# Patient Record
Sex: Female | Born: 1990 | Race: White | Hispanic: No | Marital: Married | Smoking: Never smoker
Health system: Southern US, Community
[De-identification: ages and names within clinical notes are randomized; demographics above are authoritative.]

## PROBLEM LIST (undated history)

## (undated) DIAGNOSIS — E7800 Pure hypercholesterolemia, unspecified: Secondary | ICD-10-CM

---
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
## 2011-08-12 ENCOUNTER — Emergency Department (HOSPITAL_COMMUNITY): Payer: BC Managed Care – PPO

## 2011-08-12 ENCOUNTER — Emergency Department (HOSPITAL_COMMUNITY)
Admission: EM | Admit: 2011-08-12 | Discharge: 2011-08-12 | Disposition: A | Discharge status: Home / Self Care | Payer: BC Managed Care – PPO | Attending: Emergency Medicine | Admitting: Emergency Medicine

## 2011-08-12 ENCOUNTER — Encounter (HOSPITAL_COMMUNITY): Payer: Self-pay | Admitting: Emergency Medicine

## 2011-08-12 DIAGNOSIS — R197 Diarrhea, unspecified: Secondary | ICD-10-CM | POA: Insufficient documentation

## 2011-08-12 DIAGNOSIS — R112 Nausea with vomiting, unspecified: Secondary | ICD-10-CM | POA: Insufficient documentation

## 2011-08-12 DIAGNOSIS — R109 Unspecified abdominal pain: Secondary | ICD-10-CM | POA: Insufficient documentation

## 2011-08-12 DIAGNOSIS — K5289 Other specified noninfective gastroenteritis and colitis: Secondary | ICD-10-CM | POA: Insufficient documentation

## 2011-08-12 DIAGNOSIS — K529 Noninfective gastroenteritis and colitis, unspecified: Secondary | ICD-10-CM

## 2011-08-12 LAB — CBC
HCT: 39.8 % (ref 36.0–46.0)
Hemoglobin: 14.5 g/dL (ref 12.0–15.0)
MCH: 30.6 pg (ref 26.0–34.0)
MCHC: 36.4 g/dL — ABNORMAL HIGH (ref 30.0–36.0)
MCV: 84 fL (ref 78.0–100.0)
Platelets: 189 10*3/uL (ref 150–400)
RBC: 4.74 MIL/uL (ref 3.87–5.11)
RDW: 12.1 % (ref 11.5–15.5)
WBC: 5.7 10*3/uL (ref 4.0–10.5)

## 2011-08-12 LAB — URINALYSIS, ROUTINE W REFLEX MICROSCOPIC
Bilirubin Urine: NEGATIVE
Glucose, UA: NEGATIVE mg/dL
Hgb urine dipstick: NEGATIVE
Ketones, ur: NEGATIVE mg/dL
Leukocytes, UA: NEGATIVE
Nitrite: NEGATIVE
Protein, ur: NEGATIVE mg/dL
Specific Gravity, Urine: 1.014 (ref 1.005–1.030)
Urobilinogen, UA: 0.2 mg/dL (ref 0.0–1.0)
pH: 6 (ref 5.0–8.0)

## 2011-08-12 LAB — CLOSTRIDIUM DIFFICILE BY PCR: Toxigenic C. Difficile by PCR: NEGATIVE

## 2011-08-12 LAB — DIFFERENTIAL
Basophils Absolute: 0 10*3/uL (ref 0.0–0.1)
Basophils Relative: 0 % (ref 0–1)
Eosinophils Absolute: 0 10*3/uL (ref 0.0–0.7)
Eosinophils Relative: 0 % (ref 0–5)
Lymphocytes Relative: 9 % — ABNORMAL LOW (ref 12–46)
Lymphs Abs: 0.5 10*3/uL — ABNORMAL LOW (ref 0.7–4.0)
Monocytes Absolute: 0.5 10*3/uL (ref 0.1–1.0)
Monocytes Relative: 10 % (ref 3–12)
Neutro Abs: 4.6 10*3/uL (ref 1.7–7.7)
Neutrophils Relative %: 81 % — ABNORMAL HIGH (ref 43–77)

## 2011-08-12 LAB — BASIC METABOLIC PANEL
BUN: 6 mg/dL (ref 6–23)
CO2: 24 mEq/L (ref 19–32)
Calcium: 9.4 mg/dL (ref 8.4–10.5)
Chloride: 100 mEq/L (ref 96–112)
Creatinine, Ser: 0.61 mg/dL (ref 0.50–1.10)
GFR calc Af Amer: >90 mL/min (ref >90)
GFR calc non Af Amer: >90 mL/min (ref >90)
Glucose, Bld: 94 mg/dL (ref 70–99)
Potassium: 4.1 mEq/L (ref 3.5–5.1)
Sodium: 135 mEq/L (ref 135–145)

## 2011-08-12 LAB — POCT PREGNANCY, URINE: Preg Test, Ur: NEGATIVE

## 2011-08-12 LAB — LIPASE, BLOOD: Lipase: 33 U/L (ref 11–59)

## 2011-08-12 MED ORDER — DIPHENOXYLATE-ATROPINE 2.5-0.025 MG PO TABS: 1.0000 | ORAL_TABLET | Freq: Four times a day (QID) | ORAL | Status: DC | PRN

## 2011-08-12 MED ORDER — DIPHENOXYLATE-ATROPINE 2.5-0.025 MG PO TABS: 1.0000 | ORAL_TABLET | Freq: Four times a day (QID) | ORAL | Status: AC | PRN

## 2011-08-12 MED ORDER — MORPHINE SULFATE 4 MG/ML IJ SOLN
4.0000 mg | Freq: Once | INTRAMUSCULAR | Status: AC
  Administered 2011-08-12: 4 mg via INTRAVENOUS
  Filled 2011-08-12: qty 1

## 2011-08-12 MED ORDER — ONDANSETRON HCL 4 MG PO TABS: 4.0000 mg | ORAL_TABLET | Freq: Four times a day (QID) | ORAL | Status: DC

## 2011-08-12 MED ORDER — SODIUM CHLORIDE 0.9 % IV BOLUS (SEPSIS)
1000.0000 mL | Freq: Once | INTRAVENOUS | Status: AC
  Administered 2011-08-12 (×2): 1000 mL via INTRAVENOUS

## 2011-08-12 MED ORDER — OXYCODONE-ACETAMINOPHEN 5-325 MG PO TABS: 2.0000 | ORAL_TABLET | ORAL | Status: DC | PRN

## 2011-08-12 MED ORDER — ONDANSETRON HCL 4 MG PO TABS: 4.0000 mg | ORAL_TABLET | Freq: Four times a day (QID) | ORAL | Status: AC

## 2011-08-12 MED ORDER — OXYCODONE-ACETAMINOPHEN 5-325 MG PO TABS: 2.0000 | ORAL_TABLET | ORAL | Status: AC | PRN

## 2011-08-12 NOTE — ED Notes (Signed)
Pt c/o upper abd pain and diarrhea starting today; pt sts decrease in appetite

## 2011-08-12 NOTE — ED Provider Notes (Signed)
History     CSN: [id number]  Arrival date & time 08/12/11  1502   First MD Initiated Contact with Patient 08/12/11 1753      Chief Complaint  Patient presents with  . Diarrhea  . Abdominal Pain    (Consider location/radiation/quality/duration/timing/severity/associated sxs/prior treatment) Patient is a 21 y.o. female presenting with diarrhea and abdominal pain. The history is provided by the patient and a parent. No language interpreter was used.  Diarrhea The primary symptoms include fever, fatigue, abdominal pain, nausea, vomiting and diarrhea. The illness began 3 to 5 days ago. The onset was gradual. The problem has been gradually worsening.  The illness is also significant for anorexia and bloating. The illness does not include chills, constipation, back pain or itching. Associated medical issues do not include GERD, gallstones, liver disease, alcohol abuse, PUD, gastric bypass, bowel resection, irritable bowel syndrome, hemorrhoids or diverticulitis.  Abdominal Pain The primary symptoms of the illness include abdominal pain, fever, fatigue, nausea, vomiting and diarrhea.  Additional symptoms associated with the illness include anorexia. Symptoms associated with the illness do not include chills, constipation or back pain. Significant associated medical issues do not include PUD, GERD, gallstones, liver disease or diverticulitis.   patient complaining of diarrhea greater than 10 times today. States on Friday she vomited times one and had diarrhea for 5 times the fever of 101. States over the weekend she got better and today the diarrhea came back. Denies blood in stool. Denies fever today. Having generalized abdominal pain. States that she was on antibiotics 3 weeks ago. She is a Physicist, medical.   History reviewed. No pertinent past medical history.  History reviewed. No pertinent past surgical history.  History reviewed. No pertinent family history.  History  Substance Use  Topics  . Smoking status: Never Smoker   . Smokeless tobacco: Not on file  . Alcohol Use: No    OB History    Grav Para Term Preterm Abortions TAB SAB Ect Mult Living                  Review of Systems  Unable to perform ROS Constitutional: Positive for fever and fatigue. Negative for chills.  HENT: Negative.   Eyes: Negative.   Respiratory: Negative.   Cardiovascular: Negative.   Gastrointestinal: Positive for nausea, vomiting, abdominal pain, diarrhea, bloating and anorexia. Negative for constipation, blood in stool, abdominal distention and rectal pain.  Musculoskeletal: Negative for back pain.  Skin: Negative for itching.  Neurological: Negative.  Negative for dizziness, weakness and light-headedness.  Psychiatric/Behavioral: Negative.   All other systems reviewed and are negative.    Allergies  Biaxin and Peanut-containing drug products  Home Medications   Current Outpatient Rx  Name Route Sig Dispense Refill  . ALBUTEROL SULFATE HFA 108 (90 BASE) MCG/ACT IN AERS Inhalation Inhale 2 puffs into the lungs every 6 (six) hours as needed. As needed for exercise induced asthma.    . CETIRIZINE-PSEUDOEPHEDRINE ER 5-120 MG PO TB12 Oral Take 1 tablet by mouth daily as needed. As needed for allergies.    Azzie Roup ACE-ETH ESTRAD-FE 1-20 MG-MCG(24) PO TABS Oral Take 1 tablet by mouth daily.      BP 122/73  Pulse 96  Temp(Src) 98 F (36.7 C) (Oral)  Resp 18  SpO2 100%  Physical Exam  Nursing note and vitals reviewed. Constitutional: She is oriented to person, place, and time. She appears well-developed and well-nourished.  HENT:  Head: Normocephalic.  Eyes: Conjunctivae and EOM are normal.  Pupils are equal, round, and reactive to light.  Neck: Normal range of motion. Neck supple.  Cardiovascular: Normal rate.   Pulmonary/Chest: Effort normal.  Abdominal: Soft. She exhibits no distension. There is tenderness.       Generalized abdominal pain   Musculoskeletal:  Normal range of motion.  Neurological: She is alert and oriented to person, place, and time.  Skin: Skin is warm and dry.  Psychiatric: She has a normal mood and affect.    ED Course  Procedures (including critical care time)  Labs Reviewed  CBC - Abnormal; Notable for the following:    MCHC 36.4 (*)    All other components within normal limits  DIFFERENTIAL - Abnormal; Notable for the following:    Neutrophils Relative 81 (*)    Lymphocytes Relative 9 (*)    Lymphs Abs 0.5 (*)    All other components within normal limits  URINALYSIS, ROUTINE W REFLEX MICROSCOPIC  BASIC METABOLIC PANEL  LIPASE, BLOOD  POCT PREGNANCY, URINE  CLOSTRIDIUM DIFFICILE BY PCR   No results found.   No diagnosis found.    MDM  Gastroenteritis with RUQ abdominal pain.  U/s - for gallbladder dz. Zofran for nausea and lomotil for diarrhea.  Recent antibiotics c-diff pending.  Will follow up with pcp or return to ER if not better.    Labs Reviewed  CBC - Abnormal; Notable for the following:    MCHC 36.4 (*)    All other components within normal limits  DIFFERENTIAL - Abnormal; Notable for the following:    Neutrophils Relative 81 (*)    Lymphocytes Relative 9 (*)    Lymphs Abs 0.5 (*)    All other components within normal limits  URINALYSIS, ROUTINE W REFLEX MICROSCOPIC  BASIC METABOLIC PANEL  LIPASE, BLOOD  POCT PREGNANCY, URINE  CLOSTRIDIUM DIFFICILE BY PCR  LAB REPORT - SCANNED          Jethro Bastos, NP 08/13/11 1005

## 2011-08-12 NOTE — Discharge Instructions (Signed)
Chloe Flores the u/s of your abdomen was normal today.  You have gastroenteritis.  This is contageous.  There are other labs pending.  You will get a call if they come back positive.    Take the zofran for nausea and lomotil for diarrhea.  Take tylenol for fever.  Return for uncontrolled nausea, vomiting and diarrhea. Followup with your PCP this week if not better.  Diet for Diarrhea, Adult Having frequent, runny stools (diarrhea) has many causes. Diarrhea may be caused or worsened by food or drink. Diarrhea may be relieved by changing your diet. IF YOU ARE NOT TOLERATING SOLID FOODS:  Drink enough water and fluids to keep your urine clear or pale yellow.   Avoid sugary drinks and sodas as well as milk-based beverages.   Avoid beverages containing caffeine and alcohol.   You may try rehydrating beverages. You can make your own by following this recipe:    tsp table salt.    tsp baking soda.   ? tsp salt substitute (potassium chloride).   1 tbs + 1 tsp sugar.   1 qt water.  As your stools become more solid, you can start eating solid foods. Add foods one at a time. If a certain food causes your diarrhea to get worse, avoid that food and try other foods. A low fiber, low-fat, and lactose-free diet is recommended. Small, frequent meals may be better tolerated.  Starches  Allowed:  White, [place], and pita breads, plain rolls, buns, bagels. Plain muffins, matzo. Soda, saltine, or graham crackers. Pretzels, melba toast, zwieback. Cooked cereals made with water: cornmeal, farina, cream cereals. Dry cereals: refined corn, wheat, rice. Potatoes prepared any way without skins, refined macaroni, spaghetti, noodles, refined rice.   Avoid:  Bread, rolls, or crackers made with whole wheat, multi-grains, rye, bran seeds, nuts, or coconut. Corn tortillas or taco shells. Cereals containing whole grains, multi-grains, bran, coconut, nuts, or raisins. Cooked or dry oatmeal. Coarse wheat cereals, granola.  Cereals advertised as "high-fiber." Potato skins. Whole grain pasta, wild or brown rice. Popcorn. Sweet potatoes/yams. Sweet rolls, doughnuts, waffles, pancakes, sweet breads.  Vegetables  Allowed: Strained tomato and vegetable juices. Most well-cooked and canned vegetables without seeds. Fresh: Tender lettuce, cucumber without the skin, cabbage, spinach, bean sprouts.   Avoid: Fresh, cooked, or canned: Artichokes, baked beans, beet greens, broccoli, Brussels sprouts, corn, kale, legumes, peas, sweet potatoes. Cooked: Green or red cabbage, spinach. Avoid large servings of any vegetables, because vegetables shrink when cooked, and they contain more fiber per serving than fresh vegetables.  Fruit  Allowed: All fruit juices except prune juice. Cooked or canned: Apricots, applesauce, cantaloupe, cherries, fruit cocktail, grapefruit, grapes, kiwi, mandarin oranges, peaches, pears, plums, watermelon. Fresh: Apples without skin, ripe banana, grapes, cantaloupe, cherries, grapefruit, peaches, oranges, plums. Keep servings limited to  cup or 1 piece.   Avoid: Fresh: Apple with skin, apricots, mango, pears, raspberries, strawberries. Prune juice, stewed or dried prunes. Dried fruits, raisins, dates. Large servings of all fresh fruits.  Meat and Meat Substitutes  Allowed: Ground or well-cooked tender beef, ham, veal, lamb, pork, or poultry. Eggs, plain cheese. Fish, oysters, shrimp, lobster, other seafoods. Liver, organ meats.   Avoid: Tough, fibrous meats with gristle. Peanut butter, smooth or chunky. Cheese, nuts, seeds, legumes, dried peas, beans, lentils.  Milk  Allowed: Yogurt, lactose-free milk, kefir, drinkable yogurt, buttermilk, soy milk.   Avoid: Milk, chocolate milk, beverages made with milk, such as milk shakes.  Soups  Allowed: Bouillon, broth, or soups  made from allowed foods. Any strained soup.   Avoid: Soups made from vegetables that are not allowed, cream or milk-based soups.    Desserts and Sweets  Allowed: Sugar-free gelatin, sugar-free frozen ice pops made without sugar alcohol.   Avoid: Plain cakes and cookies, pie made with allowed fruit, pudding, custard, cream pie. Gelatin, fruit, ice, sherbet, frozen ice pops. Ice cream, ice milk without nuts. Plain hard candy, honey, jelly, molasses, syrup, sugar, chocolate syrup, gumdrops, marshmallows.  Fats and Oils  Allowed: Avoid any fats and oils.   Avoid: Seeds, nuts, olives, avocados. Margarine, butter, cream, mayonnaise, salad oils, plain salad dressings made from allowed foods. Plain gravy, crisp bacon without rind.  Beverages  Allowed: Water, decaffeinated teas, oral rehydration solutions, sugar-free beverages.   Avoid: Fruit juices, caffeinated beverages (coffee, tea, soda or pop), alcohol, sports drinks, or lemon-lime soda or pop.  Condiments  Allowed: Ketchup, mustard, horseradish, vinegar, cream sauce, cheese sauce, cocoa powder. Spices in moderation: allspice, basil, bay leaves, celery powder or leaves, cinnamon, cumin powder, curry powder, ginger, mace, marjoram, onion or garlic powder, oregano, paprika, parsley flakes, ground pepper, rosemary, sage, savory, tarragon, thyme, turmeric.   Avoid: Coconut, honey.  Weight Monitoring: Weigh yourself every day. You should weigh yourself in the morning after you urinate and before you eat breakfast. Wear the same amount of clothing when you weigh yourself. Record your weight daily. Bring your recorded weights to your clinic visits. Tell your caregiver right away if you have gained 3 lb/1.4 kg or more in 1 day, 5 lb/2.3 kg in a week, or whatever amount you were told to report. SEEK IMMEDIATE MEDICAL CARE IF:   You are unable to keep fluids down.   You start to throw up (vomit) or diarrhea keeps coming back (persistent).   Abdominal pain develops, increases, or can be felt in one place (localizes).   You have an oral temperature above 102 F (38.9 C), not  controlled by medicine.   Diarrhea contains blood or mucus.   You develop excessive weakness, dizziness, fainting, or extreme thirst.  MAKE SURE YOU:   Understand these instructions.   Will watch your condition.   Will get help right away if you are not doing well or get worse.  Document Released: 07/27/2003 Document Revised: 04/25/2011 Document Reviewed: 11/17/2008 [facility] Patient Information 2012 [place], [place].Diet for Diarrhea, Adult Having frequent, runny stools (diarrhea) has many causes. Diarrhea may be caused or worsened by food or drink. Diarrhea may be relieved by changing your diet. IF YOU ARE NOT TOLERATING SOLID FOODS:  Drink enough water and fluids to keep your urine clear or pale yellow.   Avoid sugary drinks and sodas as well as milk-based beverages.   Avoid beverages containing caffeine and alcohol.   You may try rehydrating beverages. You can make your own by following this recipe:    tsp table salt.    tsp baking soda.   ? tsp salt substitute (potassium chloride).   1 tbs + 1 tsp sugar.   1 qt water.  As your stools become more solid, you can start eating solid foods. Add foods one at a time. If a certain food causes your diarrhea to get worse, avoid that food and try other foods. A low fiber, low-fat, and lactose-free diet is recommended. Small, frequent meals may be better tolerated.  Starches  Allowed:  White, [place], and pita breads, plain rolls, buns, bagels. Plain muffins, matzo. Soda, saltine, or graham crackers. Pretzels, melba toast, zwieback.  Cooked cereals made with water: cornmeal, farina, cream cereals. Dry cereals: refined corn, wheat, rice. Potatoes prepared any way without skins, refined macaroni, spaghetti, noodles, refined rice.   Avoid:  Bread, rolls, or crackers made with whole wheat, multi-grains, rye, bran seeds, nuts, or coconut. Corn tortillas or taco shells. Cereals containing whole grains, multi-grains, bran, coconut, nuts, or  raisins. Cooked or dry oatmeal. Coarse wheat cereals, granola. Cereals advertised as "high-fiber." Potato skins. Whole grain pasta, wild or brown rice. Popcorn. Sweet potatoes/yams. Sweet rolls, doughnuts, waffles, pancakes, sweet breads.  Vegetables  Allowed: Strained tomato and vegetable juices. Most well-cooked and canned vegetables without seeds. Fresh: Tender lettuce, cucumber without the skin, cabbage, spinach, bean sprouts.   Avoid: Fresh, cooked, or canned: Artichokes, baked beans, beet greens, broccoli, Brussels sprouts, corn, kale, legumes, peas, sweet potatoes. Cooked: Green or red cabbage, spinach. Avoid large servings of any vegetables, because vegetables shrink when cooked, and they contain more fiber per serving than fresh vegetables.  Fruit  Allowed: All fruit juices except prune juice. Cooked or canned: Apricots, applesauce, cantaloupe, cherries, fruit cocktail, grapefruit, grapes, kiwi, mandarin oranges, peaches, pears, plums, watermelon. Fresh: Apples without skin, ripe banana, grapes, cantaloupe, cherries, grapefruit, peaches, oranges, plums. Keep servings limited to  cup or 1 piece.   Avoid: Fresh: Apple with skin, apricots, mango, pears, raspberries, strawberries. Prune juice, stewed or dried prunes. Dried fruits, raisins, dates. Large servings of all fresh fruits.  Meat and Meat Substitutes  Allowed: Ground or well-cooked tender beef, ham, veal, lamb, pork, or poultry. Eggs, plain cheese. Fish, oysters, shrimp, lobster, other seafoods. Liver, organ meats.   Avoid: Tough, fibrous meats with gristle. Peanut butter, smooth or chunky. Cheese, nuts, seeds, legumes, dried peas, beans, lentils.  Milk  Allowed: Yogurt, lactose-free milk, kefir, drinkable yogurt, buttermilk, soy milk.   Avoid: Milk, chocolate milk, beverages made with milk, such as milk shakes.  Soups  Allowed: Bouillon, broth, or soups made from allowed foods. Any strained soup.   Avoid: Soups made from  vegetables that are not allowed, cream or milk-based soups.  Desserts and Sweets  Allowed: Sugar-free gelatin, sugar-free frozen ice pops made without sugar alcohol.   Avoid: Plain cakes and cookies, pie made with allowed fruit, pudding, custard, cream pie. Gelatin, fruit, ice, sherbet, frozen ice pops. Ice cream, ice milk without nuts. Plain hard candy, honey, jelly, molasses, syrup, sugar, chocolate syrup, gumdrops, marshmallows.  Fats and Oils  Allowed: Avoid any fats and oils.   Avoid: Seeds, nuts, olives, avocados. Margarine, butter, cream, mayonnaise, salad oils, plain salad dressings made from allowed foods. Plain gravy, crisp bacon without rind.  Beverages  Allowed: Water, decaffeinated teas, oral rehydration solutions, sugar-free beverages.   Avoid: Fruit juices, caffeinated beverages (coffee, tea, soda or pop), alcohol, sports drinks, or lemon-lime soda or pop.  Condiments  Allowed: Ketchup, mustard, horseradish, vinegar, cream sauce, cheese sauce, cocoa powder. Spices in moderation: allspice, basil, bay leaves, celery powder or leaves, cinnamon, cumin powder, curry powder, ginger, mace, marjoram, onion or garlic powder, oregano, paprika, parsley flakes, ground pepper, rosemary, sage, savory, tarragon, thyme, turmeric.   Avoid: Coconut, honey.  Weight Monitoring: Weigh yourself every day. You should weigh yourself in the morning after you urinate and before you eat breakfast. Wear the same amount of clothing when you weigh yourself. Record your weight daily. Bring your recorded weights to your clinic visits. Tell your caregiver right away if you have gained 3 lb/1.4 kg or more in 1 day, 5 lb/2.3 kg in  a week, or whatever amount you were told to report. SEEK IMMEDIATE MEDICAL CARE IF:   You are unable to keep fluids down.   You start to throw up (vomit) or diarrhea keeps coming back (persistent).   Abdominal pain develops, increases, or can be felt in one place (localizes).    You have an oral temperature above 102 F (38.9 C), not controlled by medicine.   Diarrhea contains blood or mucus.   You develop excessive weakness, dizziness, fainting, or extreme thirst.  MAKE SURE YOU:   Understand these instructions.   Will watch your condition.   Will get help right away if you are not doing well or get worse.  Document Released: 07/27/2003 Document Revised: 04/25/2011 Document Reviewed: 11/17/2008 [facility] Patient Information 2012 [place], [place].Diet for Diarrhea, Adult Having frequent, runny stools (diarrhea) has many causes. Diarrhea may be caused or worsened by food or drink. Diarrhea may be relieved by changing your diet. IF YOU ARE NOT TOLERATING SOLID FOODS:  Drink enough water and fluids to keep your urine clear or pale yellow.   Avoid sugary drinks and sodas as well as milk-based beverages.   Avoid beverages containing caffeine and alcohol.   You may try rehydrating beverages. You can make your own by following this recipe:    tsp table salt.    tsp baking soda.   ? tsp salt substitute (potassium chloride).   1 tbs + 1 tsp sugar.   1 qt water.  As your stools become more solid, you can start eating solid foods. Add foods one at a time. If a certain food causes your diarrhea to get worse, avoid that food and try other foods. A low fiber, low-fat, and lactose-free diet is recommended. Small, frequent meals may be better tolerated.  Starches  Allowed:  White, [place], and pita breads, plain rolls, buns, bagels. Plain muffins, matzo. Soda, saltine, or graham crackers. Pretzels, melba toast, zwieback. Cooked cereals made with water: cornmeal, farina, cream cereals. Dry cereals: refined corn, wheat, rice. Potatoes prepared any way without skins, refined macaroni, spaghetti, noodles, refined rice.   Avoid:  Bread, rolls, or crackers made with whole wheat, multi-grains, rye, bran seeds, nuts, or coconut. Corn tortillas or taco shells. Cereals  containing whole grains, multi-grains, bran, coconut, nuts, or raisins. Cooked or dry oatmeal. Coarse wheat cereals, granola. Cereals advertised as "high-fiber." Potato skins. Whole grain pasta, wild or brown rice. Popcorn. Sweet potatoes/yams. Sweet rolls, doughnuts, waffles, pancakes, sweet breads.  Vegetables  Allowed: Strained tomato and vegetable juices. Most well-cooked and canned vegetables without seeds. Fresh: Tender lettuce, cucumber without the skin, cabbage, spinach, bean sprouts.   Avoid: Fresh, cooked, or canned: Artichokes, baked beans, beet greens, broccoli, Brussels sprouts, corn, kale, legumes, peas, sweet potatoes. Cooked: Green or red cabbage, spinach. Avoid large servings of any vegetables, because vegetables shrink when cooked, and they contain more fiber per serving than fresh vegetables.  Fruit  Allowed: All fruit juices except prune juice. Cooked or canned: Apricots, applesauce, cantaloupe, cherries, fruit cocktail, grapefruit, grapes, kiwi, mandarin oranges, peaches, pears, plums, watermelon. Fresh: Apples without skin, ripe banana, grapes, cantaloupe, cherries, grapefruit, peaches, oranges, plums. Keep servings limited to  cup or 1 piece.   Avoid: Fresh: Apple with skin, apricots, mango, pears, raspberries, strawberries. Prune juice, stewed or dried prunes. Dried fruits, raisins, dates. Large servings of all fresh fruits.  Meat and Meat Substitutes  Allowed: Ground or well-cooked tender beef, ham, veal, lamb, pork, or poultry. Eggs, plain cheese. Fish, oysters,  shrimp, lobster, other seafoods. Liver, organ meats.   Avoid: Tough, fibrous meats with gristle. Peanut butter, smooth or chunky. Cheese, nuts, seeds, legumes, dried peas, beans, lentils.  Milk  Allowed: Yogurt, lactose-free milk, kefir, drinkable yogurt, buttermilk, soy milk.   Avoid: Milk, chocolate milk, beverages made with milk, such as milk shakes.  Soups  Allowed: Bouillon, broth, or soups made from  allowed foods. Any strained soup.   Avoid: Soups made from vegetables that are not allowed, cream or milk-based soups.  Desserts and Sweets  Allowed: Sugar-free gelatin, sugar-free frozen ice pops made without sugar alcohol.   Avoid: Plain cakes and cookies, pie made with allowed fruit, pudding, custard, cream pie. Gelatin, fruit, ice, sherbet, frozen ice pops. Ice cream, ice milk without nuts. Plain hard candy, honey, jelly, molasses, syrup, sugar, chocolate syrup, gumdrops, marshmallows.  Fats and Oils  Allowed: Avoid any fats and oils.   Avoid: Seeds, nuts, olives, avocados. Margarine, butter, cream, mayonnaise, salad oils, plain salad dressings made from allowed foods. Plain gravy, crisp bacon without rind.  Beverages  Allowed: Water, decaffeinated teas, oral rehydration solutions, sugar-free beverages.   Avoid: Fruit juices, caffeinated beverages (coffee, tea, soda or pop), alcohol, sports drinks, or lemon-lime soda or pop.  Condiments  Allowed: Ketchup, mustard, horseradish, vinegar, cream sauce, cheese sauce, cocoa powder. Spices in moderation: allspice, basil, bay leaves, celery powder or leaves, cinnamon, cumin powder, curry powder, ginger, mace, marjoram, onion or garlic powder, oregano, paprika, parsley flakes, ground pepper, rosemary, sage, savory, tarragon, thyme, turmeric.   Avoid: Coconut, honey.  Weight Monitoring: Weigh yourself every day. You should weigh yourself in the morning after you urinate and before you eat breakfast. Wear the same amount of clothing when you weigh yourself. Record your weight daily. Bring your recorded weights to your clinic visits. Tell your caregiver right away if you have gained 3 lb/1.4 kg or more in 1 day, 5 lb/2.3 kg in a week, or whatever amount you were told to report. SEEK IMMEDIATE MEDICAL CARE IF:   You are unable to keep fluids down.   You start to throw up (vomit) or diarrhea keeps coming back (persistent).   Abdominal pain  develops, increases, or can be felt in one place (localizes).   You have an oral temperature above 102 F (38.9 C), not controlled by medicine.   Diarrhea contains blood or mucus.   You develop excessive weakness, dizziness, fainting, or extreme thirst.  MAKE SURE YOU:   Understand these instructions.   Will watch your condition.   Will get help right away if you are not doing well or get worse.  Document Released: 07/27/2003 Document Revised: 04/25/2011 Document Reviewed: 11/17/2008 [facility] Patient Information 2012 [place], [place].Diet for Diarrhea, Adult Having frequent, runny stools (diarrhea) has many causes. Diarrhea may be caused or worsened by food or drink. Diarrhea may be relieved by changing your diet. IF YOU ARE NOT TOLERATING SOLID FOODS:  Drink enough water and fluids to keep your urine clear or pale yellow.   Avoid sugary drinks and sodas as well as milk-based beverages.   Avoid beverages containing caffeine and alcohol.   You may try rehydrating beverages. You can make your own by following this recipe:    tsp table salt.    tsp baking soda.   ? tsp salt substitute (potassium chloride).   1 tbs + 1 tsp sugar.   1 qt water.  As your stools become more solid, you can start eating solid foods. Add foods one  at a time. If a certain food causes your diarrhea to get worse, avoid that food and try other foods. A low fiber, low-fat, and lactose-free diet is recommended. Small, frequent meals may be better tolerated.  Starches  Allowed:  White, [place], and pita breads, plain rolls, buns, bagels. Plain muffins, matzo. Soda, saltine, or graham crackers. Pretzels, melba toast, zwieback. Cooked cereals made with water: cornmeal, farina, cream cereals. Dry cereals: refined corn, wheat, rice. Potatoes prepared any way without skins, refined macaroni, spaghetti, noodles, refined rice.   Avoid:  Bread, rolls, or crackers made with whole wheat, multi-grains, rye, bran seeds,  nuts, or coconut. Corn tortillas or taco shells. Cereals containing whole grains, multi-grains, bran, coconut, nuts, or raisins. Cooked or dry oatmeal. Coarse wheat cereals, granola. Cereals advertised as "high-fiber." Potato skins. Whole grain pasta, wild or brown rice. Popcorn. Sweet potatoes/yams. Sweet rolls, doughnuts, waffles, pancakes, sweet breads.  Vegetables  Allowed: Strained tomato and vegetable juices. Most well-cooked and canned vegetables without seeds. Fresh: Tender lettuce, cucumber without the skin, cabbage, spinach, bean sprouts.   Avoid: Fresh, cooked, or canned: Artichokes, baked beans, beet greens, broccoli, Brussels sprouts, corn, kale, legumes, peas, sweet potatoes. Cooked: Green or red cabbage, spinach. Avoid large servings of any vegetables, because vegetables shrink when cooked, and they contain more fiber per serving than fresh vegetables.  Fruit  Allowed: All fruit juices except prune juice. Cooked or canned: Apricots, applesauce, cantaloupe, cherries, fruit cocktail, grapefruit, grapes, kiwi, mandarin oranges, peaches, pears, plums, watermelon. Fresh: Apples without skin, ripe banana, grapes, cantaloupe, cherries, grapefruit, peaches, oranges, plums. Keep servings limited to  cup or 1 piece.   Avoid: Fresh: Apple with skin, apricots, mango, pears, raspberries, strawberries. Prune juice, stewed or dried prunes. Dried fruits, raisins, dates. Large servings of all fresh fruits.  Meat and Meat Substitutes  Allowed: Ground or well-cooked tender beef, ham, veal, lamb, pork, or poultry. Eggs, plain cheese. Fish, oysters, shrimp, lobster, other seafoods. Liver, organ meats.   Avoid: Tough, fibrous meats with gristle. Peanut butter, smooth or chunky. Cheese, nuts, seeds, legumes, dried peas, beans, lentils.  Milk  Allowed: Yogurt, lactose-free milk, kefir, drinkable yogurt, buttermilk, soy milk.   Avoid: Milk, chocolate milk, beverages made with milk, such as milk shakes.    Soups  Allowed: Bouillon, broth, or soups made from allowed foods. Any strained soup.   Avoid: Soups made from vegetables that are not allowed, cream or milk-based soups.  Desserts and Sweets  Allowed: Sugar-free gelatin, sugar-free frozen ice pops made without sugar alcohol.   Avoid: Plain cakes and cookies, pie made with allowed fruit, pudding, custard, cream pie. Gelatin, fruit, ice, sherbet, frozen ice pops. Ice cream, ice milk without nuts. Plain hard candy, honey, jelly, molasses, syrup, sugar, chocolate syrup, gumdrops, marshmallows.  Fats and Oils  Allowed: Avoid any fats and oils.   Avoid: Seeds, nuts, olives, avocados. Margarine, butter, cream, mayonnaise, salad oils, plain salad dressings made from allowed foods. Plain gravy, crisp bacon without rind.  Beverages  Allowed: Water, decaffeinated teas, oral rehydration solutions, sugar-free beverages.   Avoid: Fruit juices, caffeinated beverages (coffee, tea, soda or pop), alcohol, sports drinks, or lemon-lime soda or pop.  Condiments  Allowed: Ketchup, mustard, horseradish, vinegar, cream sauce, cheese sauce, cocoa powder. Spices in moderation: allspice, basil, bay leaves, celery powder or leaves, cinnamon, cumin powder, curry powder, ginger, mace, marjoram, onion or garlic powder, oregano, paprika, parsley flakes, ground pepper, rosemary, sage, savory, tarragon, thyme, turmeric.   Avoid: Coconut, honey.  Weight  Monitoring: Weigh yourself every day. You should weigh yourself in the morning after you urinate and before you eat breakfast. Wear the same amount of clothing when you weigh yourself. Record your weight daily. Bring your recorded weights to your clinic visits. Tell your caregiver right away if you have gained 3 lb/1.4 kg or more in 1 day, 5 lb/2.3 kg in a week, or whatever amount you were told to report. SEEK IMMEDIATE MEDICAL CARE IF:   You are unable to keep fluids down.   You start to throw up (vomit) or diarrhea  keeps coming back (persistent).   Abdominal pain develops, increases, or can be felt in one place (localizes).   You have an oral temperature above 102 F (38.9 C), not controlled by medicine.   Diarrhea contains blood or mucus.   You develop excessive weakness, dizziness, fainting, or extreme thirst.  MAKE SURE YOU:   Understand these instructions.   Will watch your condition.   Will get help right away if you are not doing well or get worse.  Document Released: 07/27/2003 Document Revised: 04/25/2011 Document Reviewed: 11/17/2008 [facility] Patient Information 2012 [place], [place].

## 2011-08-14 NOTE — ED Provider Notes (Signed)
Medical screening examination/treatment/procedure(s) were performed by non-physician practitioner and as supervising physician I was immediately available for consultation/collaboration.  Raeford Razor, MD 08/14/11 1419

## 2012-11-24 IMAGING — CR DG ABDOMEN ACUTE W/ 1V CHEST
3 series · 3 of 3 positions shown · non-contrast
Comparison: None.

CLINICAL DATA: Right-sided abdominal pain and severe diarrhea for
the past week.  Negative pregnancy test.

ACUTE ABDOMEN SERIES (ABDOMEN 2 VIEW & CHEST 1 VIEW)

[w chest pa]
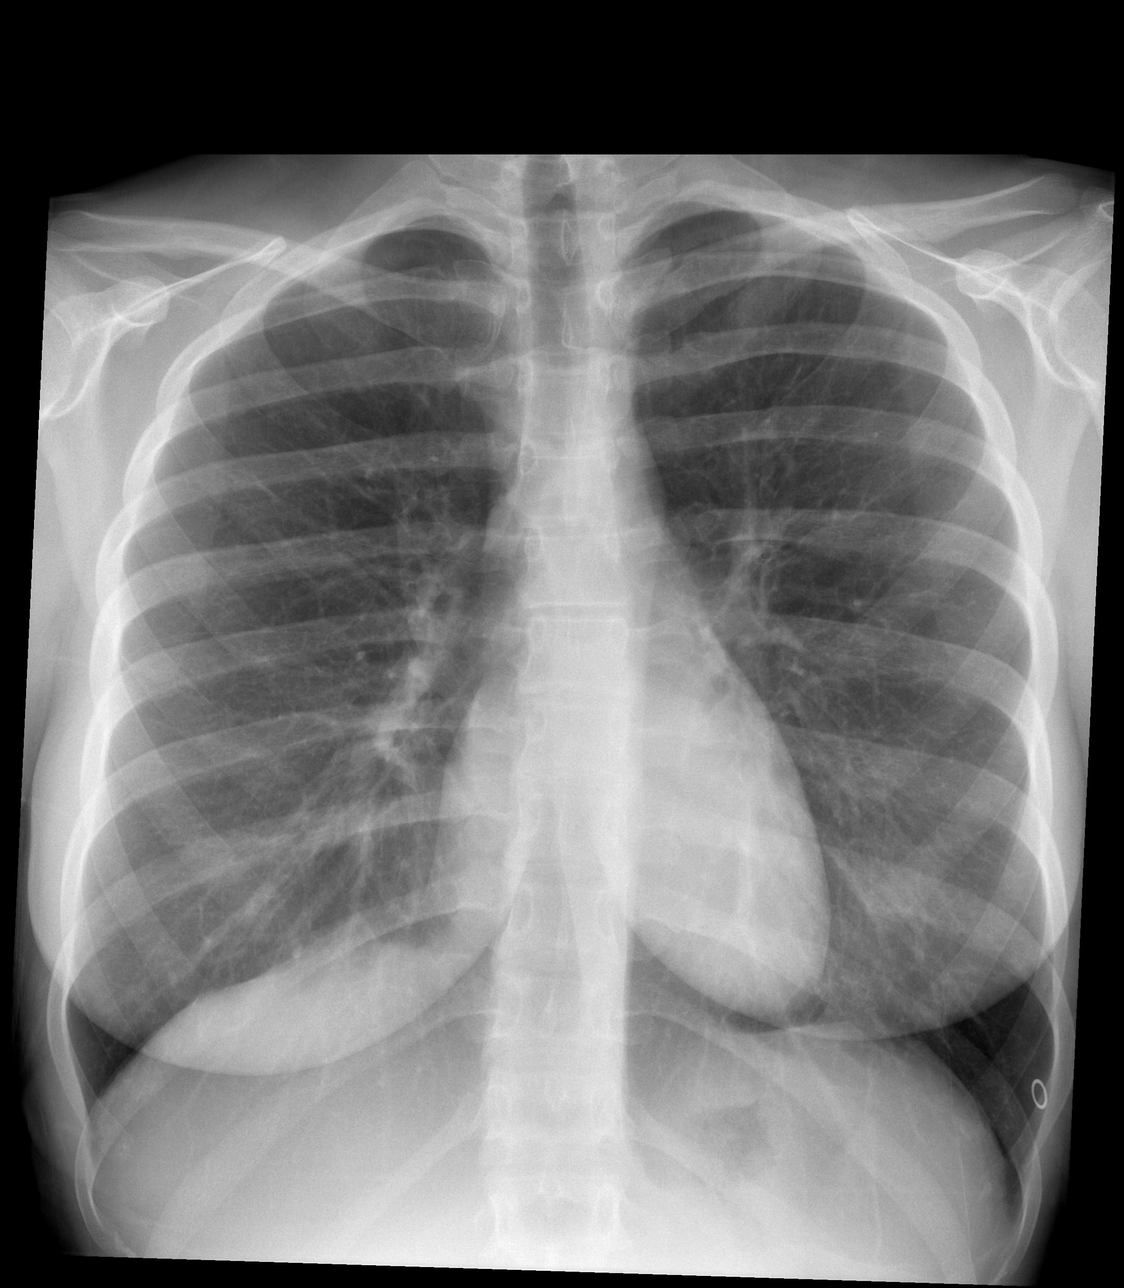

[w abdomen upright]
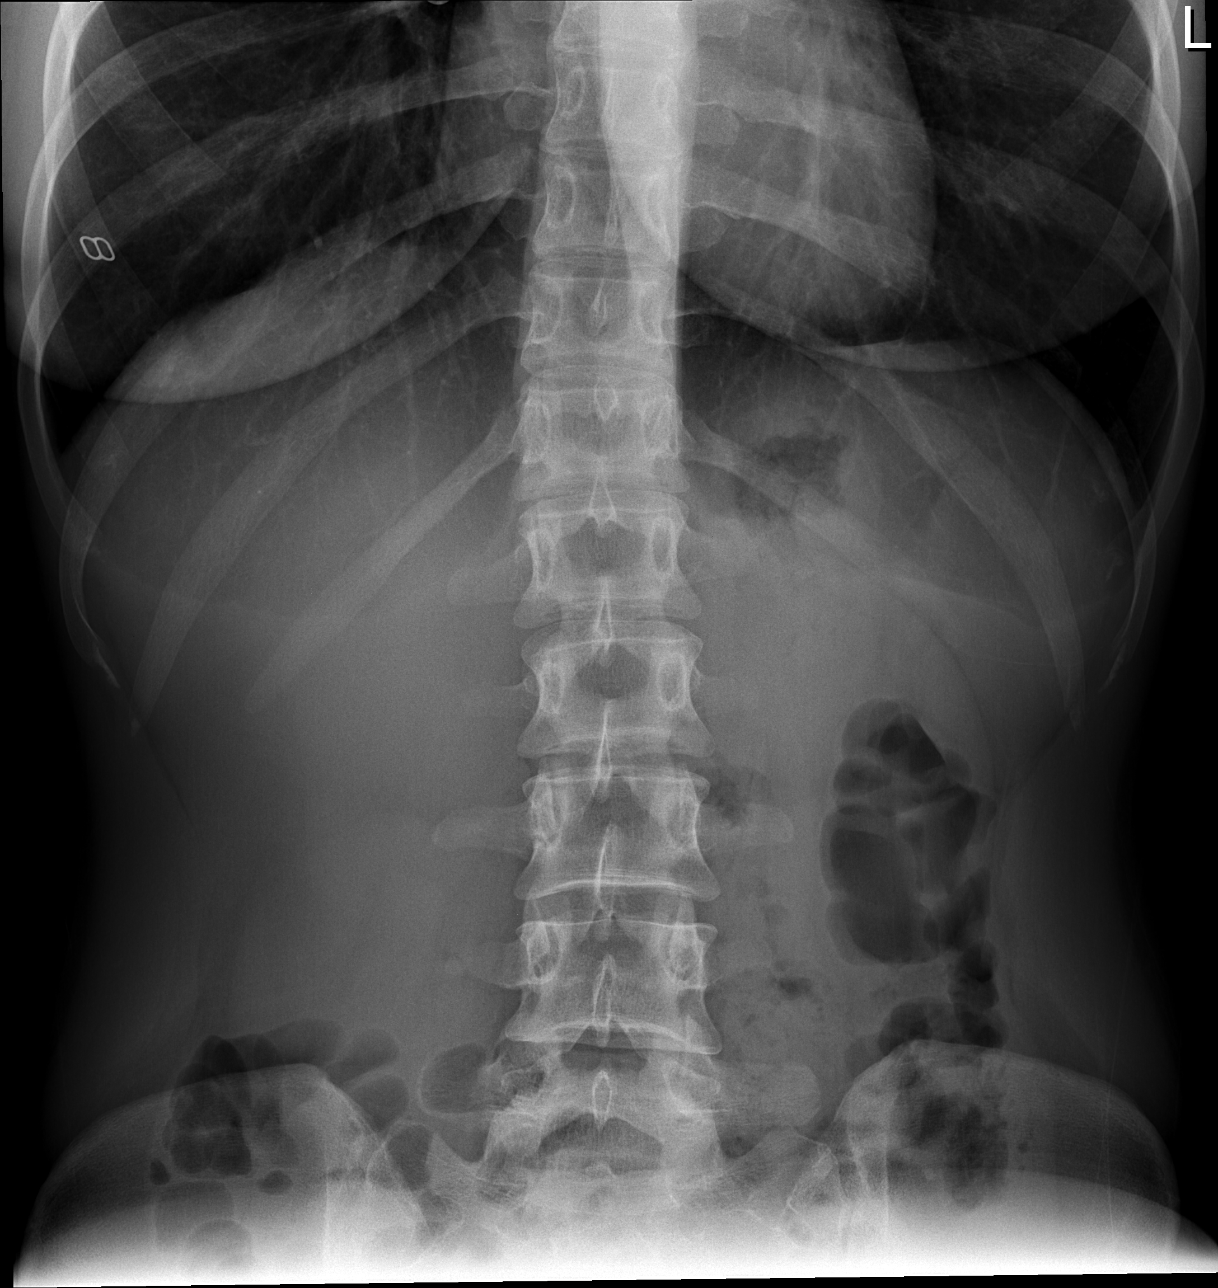

[t abdomen supine]
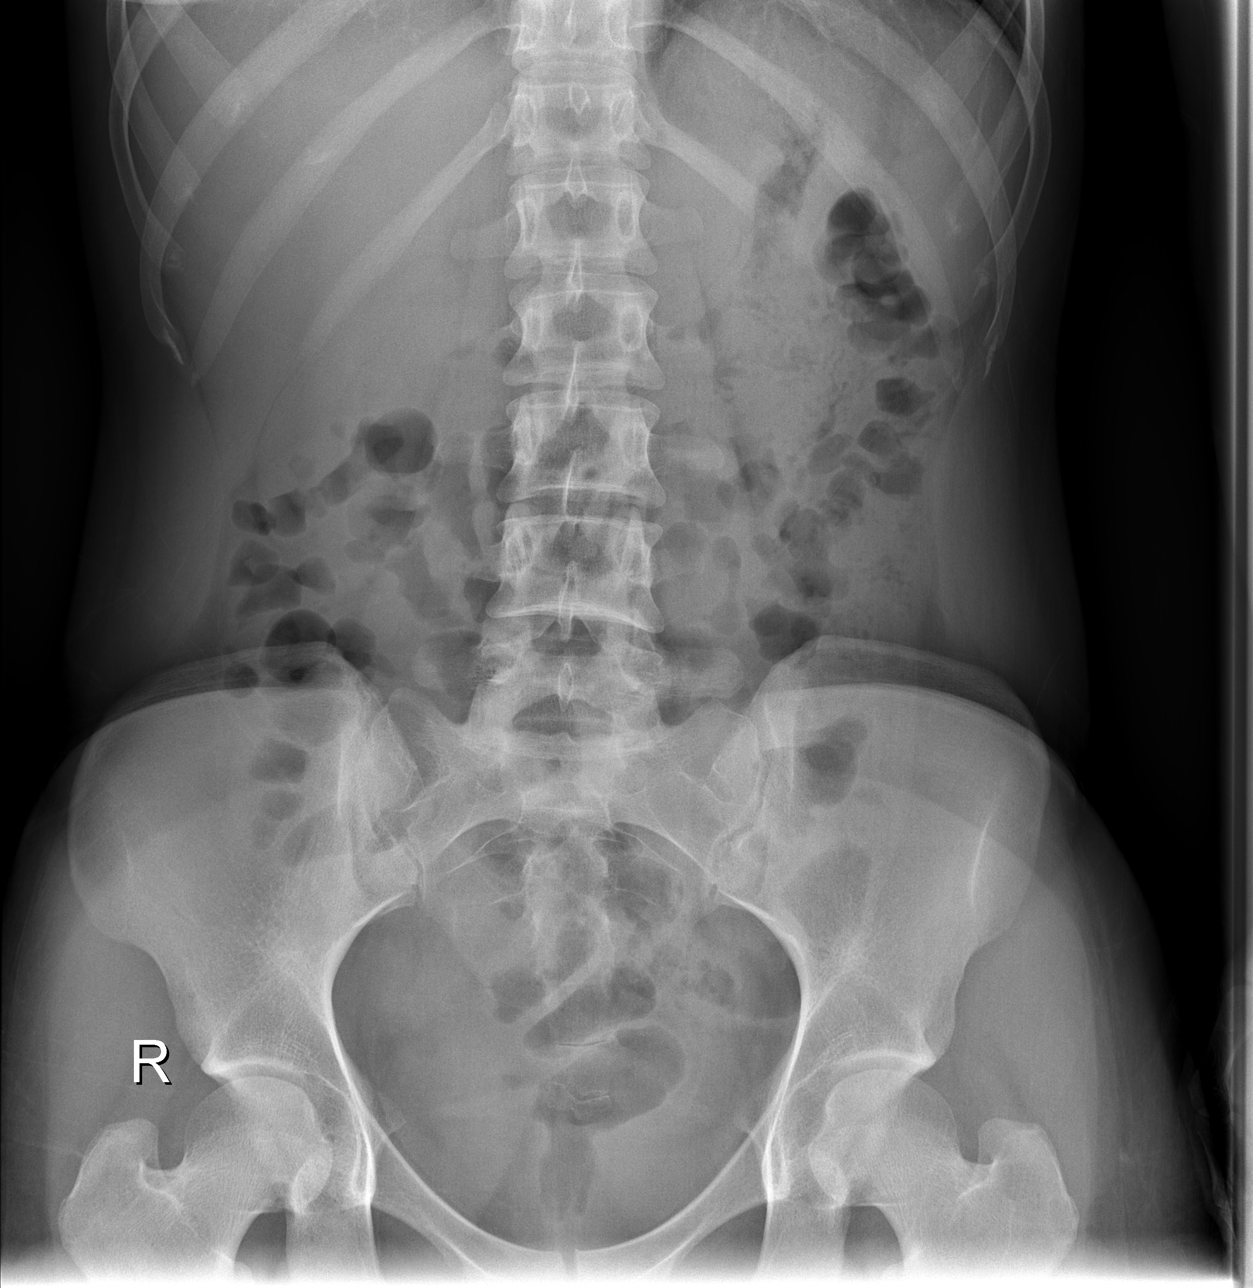

[3 of 3 positions shown; findings below may reference images not displayed]

FINDINGS: The cardiomediastinal silhouette is within normal limits.
Lungs are free of focal consolidations and pleural effusions.

No free intraperitoneal air beneath the diaphragm.  Supine and
erect views show a nonobstructive bowel gas pattern.  No abnormal
calcifications or evidence for organomegaly. Visualized osseous
structures have a normal appearance.
IMPRESSION: Negative exam.

## 2021-04-13 ENCOUNTER — Other Ambulatory Visit: Payer: Self-pay

## 2021-04-13 ENCOUNTER — Encounter (HOSPITAL_BASED_OUTPATIENT_CLINIC_OR_DEPARTMENT_OTHER): Payer: Self-pay | Admitting: *Deleted

## 2021-04-13 ENCOUNTER — Emergency Department (HOSPITAL_BASED_OUTPATIENT_CLINIC_OR_DEPARTMENT_OTHER)
Admission: EM | Admit: 2021-04-13 | Discharge: 2021-04-13 | Disposition: A | Discharge status: Home / Self Care | Payer: BLUE CROSS/BLUE SHIELD | Attending: Emergency Medicine | Admitting: Emergency Medicine

## 2021-04-13 ENCOUNTER — Emergency Department (HOSPITAL_BASED_OUTPATIENT_CLINIC_OR_DEPARTMENT_OTHER): Payer: BLUE CROSS/BLUE SHIELD | Admitting: Radiology

## 2021-04-13 DIAGNOSIS — R079 Chest pain, unspecified: Secondary | ICD-10-CM | POA: Diagnosis not present

## 2021-04-13 DIAGNOSIS — Z91010 Allergy to peanuts: Secondary | ICD-10-CM | POA: Insufficient documentation

## 2021-04-13 HISTORY — DX: Pure hypercholesterolemia, unspecified: E78.00

## 2021-04-13 LAB — BASIC METABOLIC PANEL
Anion gap: 7 (ref 5–15)
BUN: 7 mg/dL (ref 6–20)
CO2: 28 mmol/L (ref 22–32)
Calcium: 9.3 mg/dL (ref 8.9–10.3)
Chloride: 105 mmol/L (ref 98–111)
Creatinine, Ser: 0.67 mg/dL (ref 0.44–1.00)
GFR, Estimated: >60 mL/min (ref >60)
Glucose, Bld: 86 mg/dL (ref 70–99)
Potassium: 4 mmol/L (ref 3.5–5.1)
Sodium: 140 mmol/L (ref 135–145)

## 2021-04-13 LAB — CBC
HCT: 37.8 % (ref 36.0–46.0)
Hemoglobin: 12.8 g/dL (ref 12.0–15.0)
MCH: 28.9 pg (ref 26.0–34.0)
MCHC: 33.9 g/dL (ref 30.0–36.0)
MCV: 85.3 fL (ref 80.0–100.0)
Platelets: 214 10*3/uL (ref 150–400)
RBC: 4.43 MIL/uL (ref 3.87–5.11)
RDW: 12 % (ref 11.5–15.5)
WBC: 4.8 10*3/uL (ref 4.0–10.5)
nRBC: 0 % (ref 0.0–0.2)

## 2021-04-13 LAB — PREGNANCY, URINE: Preg Test, Ur: NEGATIVE

## 2021-04-13 LAB — TROPONIN I (HIGH SENSITIVITY): Troponin I (High Sensitivity): <2 ng/L

## 2021-04-13 NOTE — ED Notes (Signed)
EKG given to Dr. Pfeiffer 

## 2021-04-13 NOTE — Discharge Instructions (Signed)
You have been seen and discharged from the emergency department.  Your heart work-up was normal, blood work-up was negative.  Chest x-ray was normal.  Follow-up with your primary provider for reevaluation and further care. Take home medications as prescribed. If you have any worsening symptoms or further concerns for your health please return to an emergency department for further evaluation.

## 2021-04-13 NOTE — ED Triage Notes (Signed)
Pt reports chest pain x 1 week, also in shoulder blades and upper back. Reports feeling SOB and light headed. States she has a strong family history of cardiac disease

## 2021-04-13 NOTE — ED Provider Notes (Signed)
[facility] EMERGENCY DEPT Provider Note   CSN: [id number] Arrival date & time: 04/13/21  1304     History Chief Complaint  Patient presents with   Chest Pain    Chloe Flores is a 30 y.o. female.  HPI  30 year old female with past medical history of HLD presents emergency department with concern for chest pain.  Patient states for the past week she has been having intermittent chest pain that radiates to her upper back and bilateral shoulders and down both arms.  She states it is intermittent, nonexertional, self resolves.  At times she feels short of breath with the symptoms.  She has family history of cardiac disease but no personal history of any cardiac problems.  She denies any palpitations, swelling of her lower extremities, cough.  No recent long traveling, she does not take any hormone therapy/birth control.  No history of DVT/PE.  Past Medical History:  Diagnosis Date   High cholesterol     There are no problems to display for this patient.   History reviewed. No pertinent surgical history.   OB History   No obstetric history on file.     No family history on file.  Social History   Tobacco Use   Smoking status: Never  Vaping Use   Vaping Use: Never used  Substance Use Topics   Alcohol use: No   Drug use: No    Home Medications Prior to Admission medications   Medication Sig Start Date End Date Taking? Authorizing Provider  albuterol (PROVENTIL HFA;VENTOLIN HFA) 108 (90 BASE) MCG/ACT inhaler Inhale 2 puffs into the lungs every 6 (six) hours as needed. As needed for exercise induced asthma.    [provider]  cetirizine-pseudoephedrine (ZYRTEC-D) 5-120 MG per tablet Take 1 tablet by mouth daily as needed. As needed for allergies.    [provider]  Norethindrone Acetate-Ethinyl Estrad-FE (LOESTRIN 24 FE) 1-20 MG-MCG(24) tablet Take 1 tablet by mouth daily.    [provider]    Allergies    Clarithromycin,  Peanut-containing drug products, and Biaxin [clarithromycin]  Review of Systems   Review of Systems  Constitutional:  Negative for chills and fever.  HENT:  Negative for congestion.   Eyes:  Negative for visual disturbance.  Respiratory:  Negative for shortness of breath.   Cardiovascular:  Positive for chest pain. Negative for palpitations and leg swelling.  Gastrointestinal:  Negative for abdominal pain, diarrhea and vomiting.  Genitourinary:  Negative for dysuria.  Musculoskeletal:  Positive for back pain.       + Bilateral arm pain  Skin:  Negative for rash.  Neurological:  Negative for headaches.   Physical Exam Updated Vital Signs BP 125/69 (BP Location: Right Arm)   Pulse 62   Temp 97.9 F (36.6 C) (Oral)   Resp 16   Ht 5\' 4"  (1.626 m)   Wt 72.6 kg   SpO2 100%   Breastfeeding Yes   BMI 27.46 kg/m   Physical Exam Vitals and nursing note reviewed.  Constitutional:      General: She is not in acute distress.    Appearance: Normal appearance. She is not toxic-appearing or diaphoretic.  HENT:     Head: Normocephalic.     Mouth/Throat:     Mouth: Mucous membranes are moist.  Cardiovascular:     Rate and Rhythm: Normal rate.     Heart sounds: No murmur heard. Pulmonary:     Effort: Pulmonary effort is normal. No tachypnea or respiratory  distress.  Chest:     Chest wall: No tenderness.  Abdominal:     Palpations: Abdomen is soft.     Tenderness: There is no abdominal tenderness.  Musculoskeletal:     Right lower leg: No edema.     Left lower leg: No edema.  Skin:    General: Skin is warm.  Neurological:     Mental Status: She is alert and oriented to person, place, and time. Mental status is at baseline.  Psychiatric:        Mood and Affect: Mood normal.    ED Results / Procedures / Treatments   Labs (all labs ordered are listed, but only abnormal results are displayed) Labs Reviewed  BASIC METABOLIC PANEL  CBC  PREGNANCY, URINE  TROPONIN I (HIGH  SENSITIVITY)  TROPONIN I (HIGH SENSITIVITY)    EKG EKG Interpretation  Date/Time:  Friday April 13 2021 13:13:33 EST Ventricular Rate:  85 PR Interval:  156 QRS Duration: 72 QT Interval:  370 QTC Calculation: 440 R Axis:   84 Text Interpretation: Normal sinus rhythm no STEMI. early repolarization seen on old pediatric tracing resolved, otherwise no sig change Confirmed by Charlesetta Shanks [id number]) on 04/13/2021 1:21:43 PM  Radiology DG Chest 2 View  Result Date: 04/13/2021 CLINICAL DATA:  Chest pain, shortness of breath EXAM: CHEST - 2 VIEW COMPARISON:  08/12/2011 FINDINGS: Normal heart size and mediastinal contours. Minor streaky basilar atelectasis. No definite pneumonia, collapse or consolidation. Negative for edema, effusion or pneumothorax. Trachea midline. No acute osseous finding. IMPRESSION: Minor basilar atelectasis. No other acute process by plain radiography. Electronically Signed   By: Jerilynn Mages.  Shick M.D.   On: 04/13/2021 14:28    Procedures Procedures   Medications Ordered in ED Medications - No data to display  ED Course  I have reviewed the triage vital signs and the nursing notes.  Pertinent labs & imaging results that were available during my care of the patient were reviewed by me and considered in my medical decision making (see chart for details).    MDM Rules/Calculators/A&P                           30 year old female presents emergency department with intermittent chest pain that radiates to her back and down bilateral arms.  Vitals are stable on arrival.  EKG shows no acute ischemic changes.  Symptoms are nonexertional, intermittent, self resolved.  Blood work is reassuring, first troponin is less than 2.  Chest x-ray is unremarkable.  She is PERC negative.  Low suspicion for PE/dissection.  She is currently symptom-free.  Otherwise very well-appearing on exam.  Low suspicion for acute pathology including ACS.  Plan for outpatient follow-up.  Patient at  this time appears safe and stable for discharge and will be treated as an outpatient.  Discharge plan and strict return to ED precautions discussed, patient verbalizes understanding and agreement.  Final Clinical Impression(s) / ED Diagnoses Final diagnoses:  Chest pain, unspecified type    Rx / DC Orders ED Discharge Orders     None        Lorelle Gibbs, DO 04/13/21 1824

## 2022-07-27 IMAGING — DX DG CHEST 2V
2 series · 2 of 2 positions shown · non-contrast
Comparison: 08/12/2011

CLINICAL DATA: Chest pain, shortness of breath

EXAM:
CHEST - 2 VIEW

[chest pa]
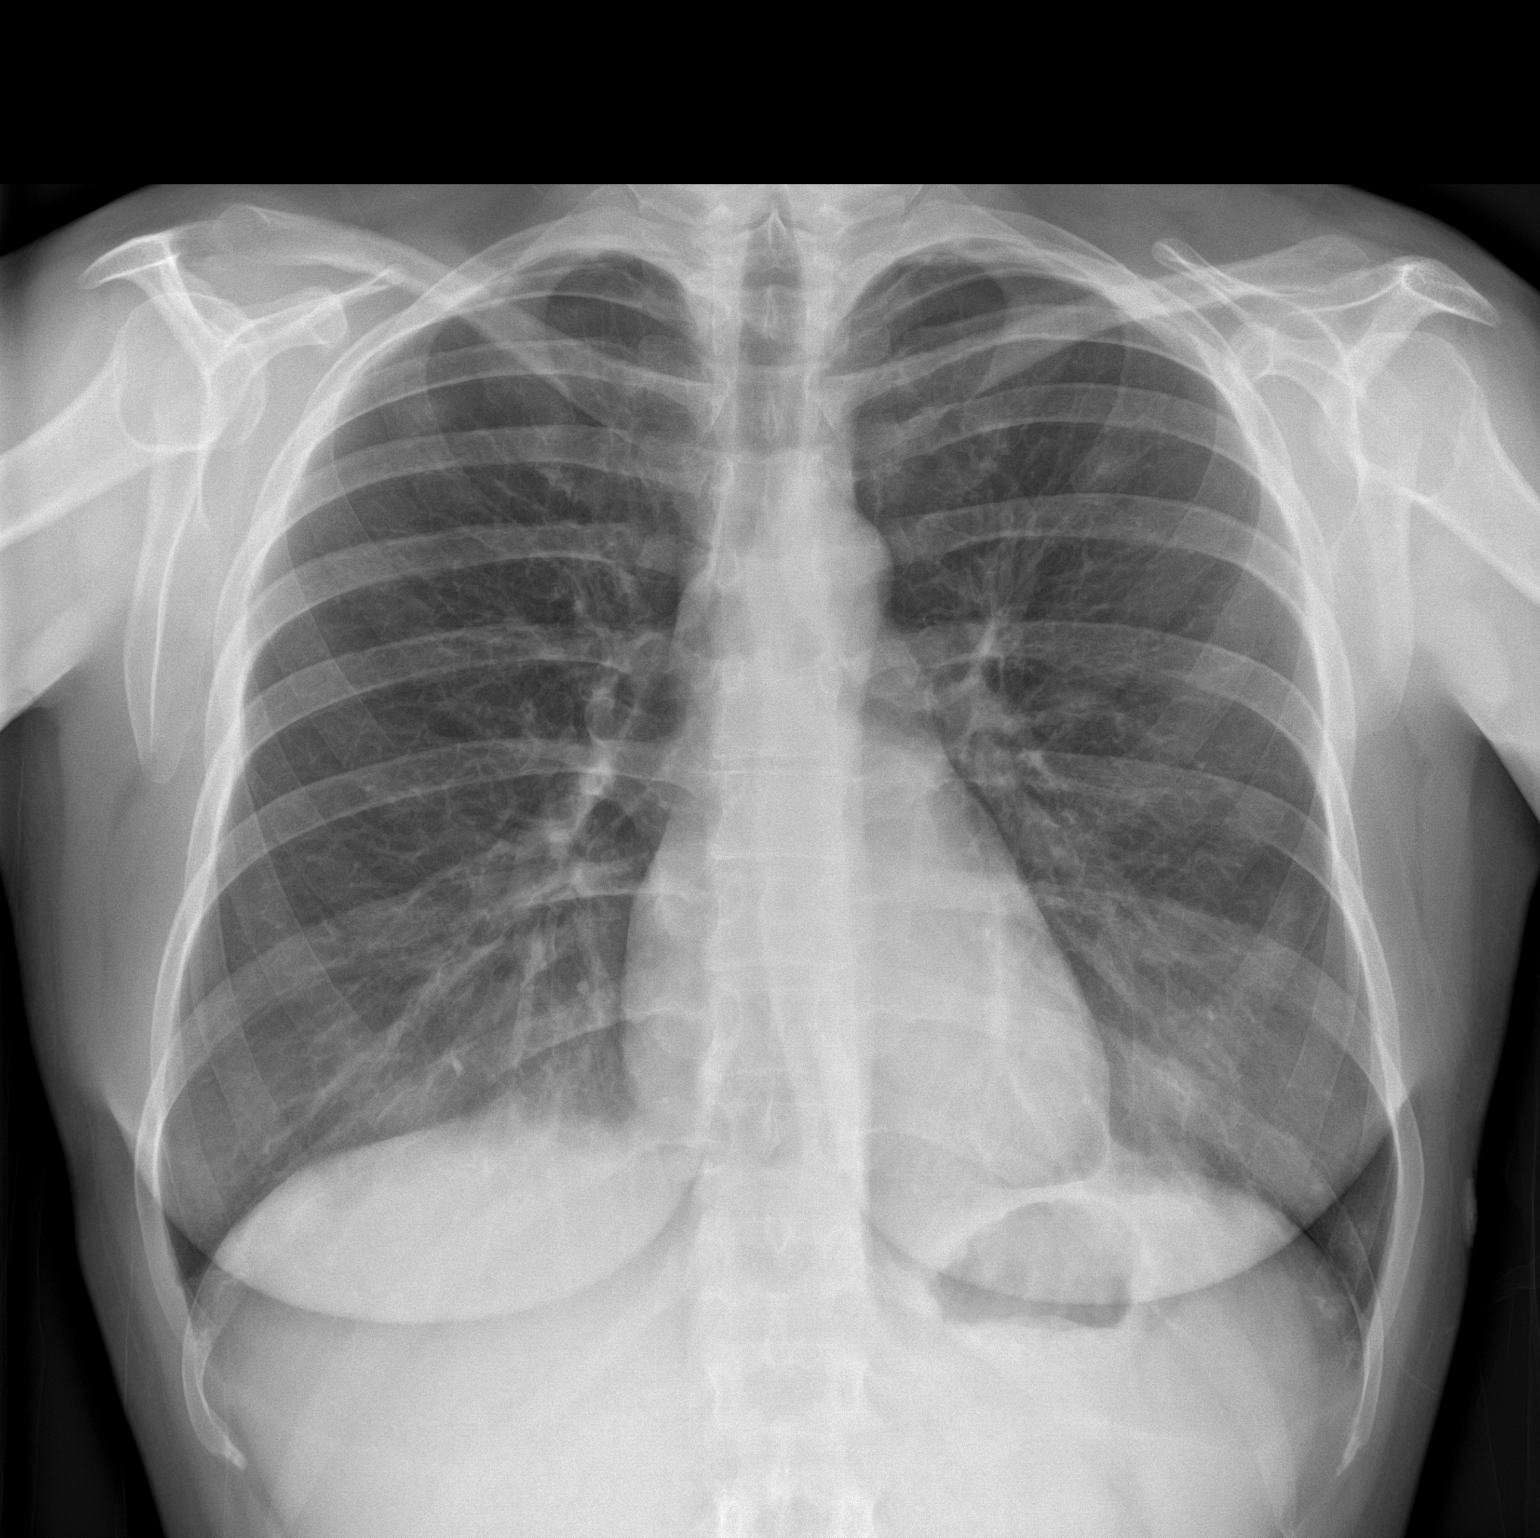

[chest lat]
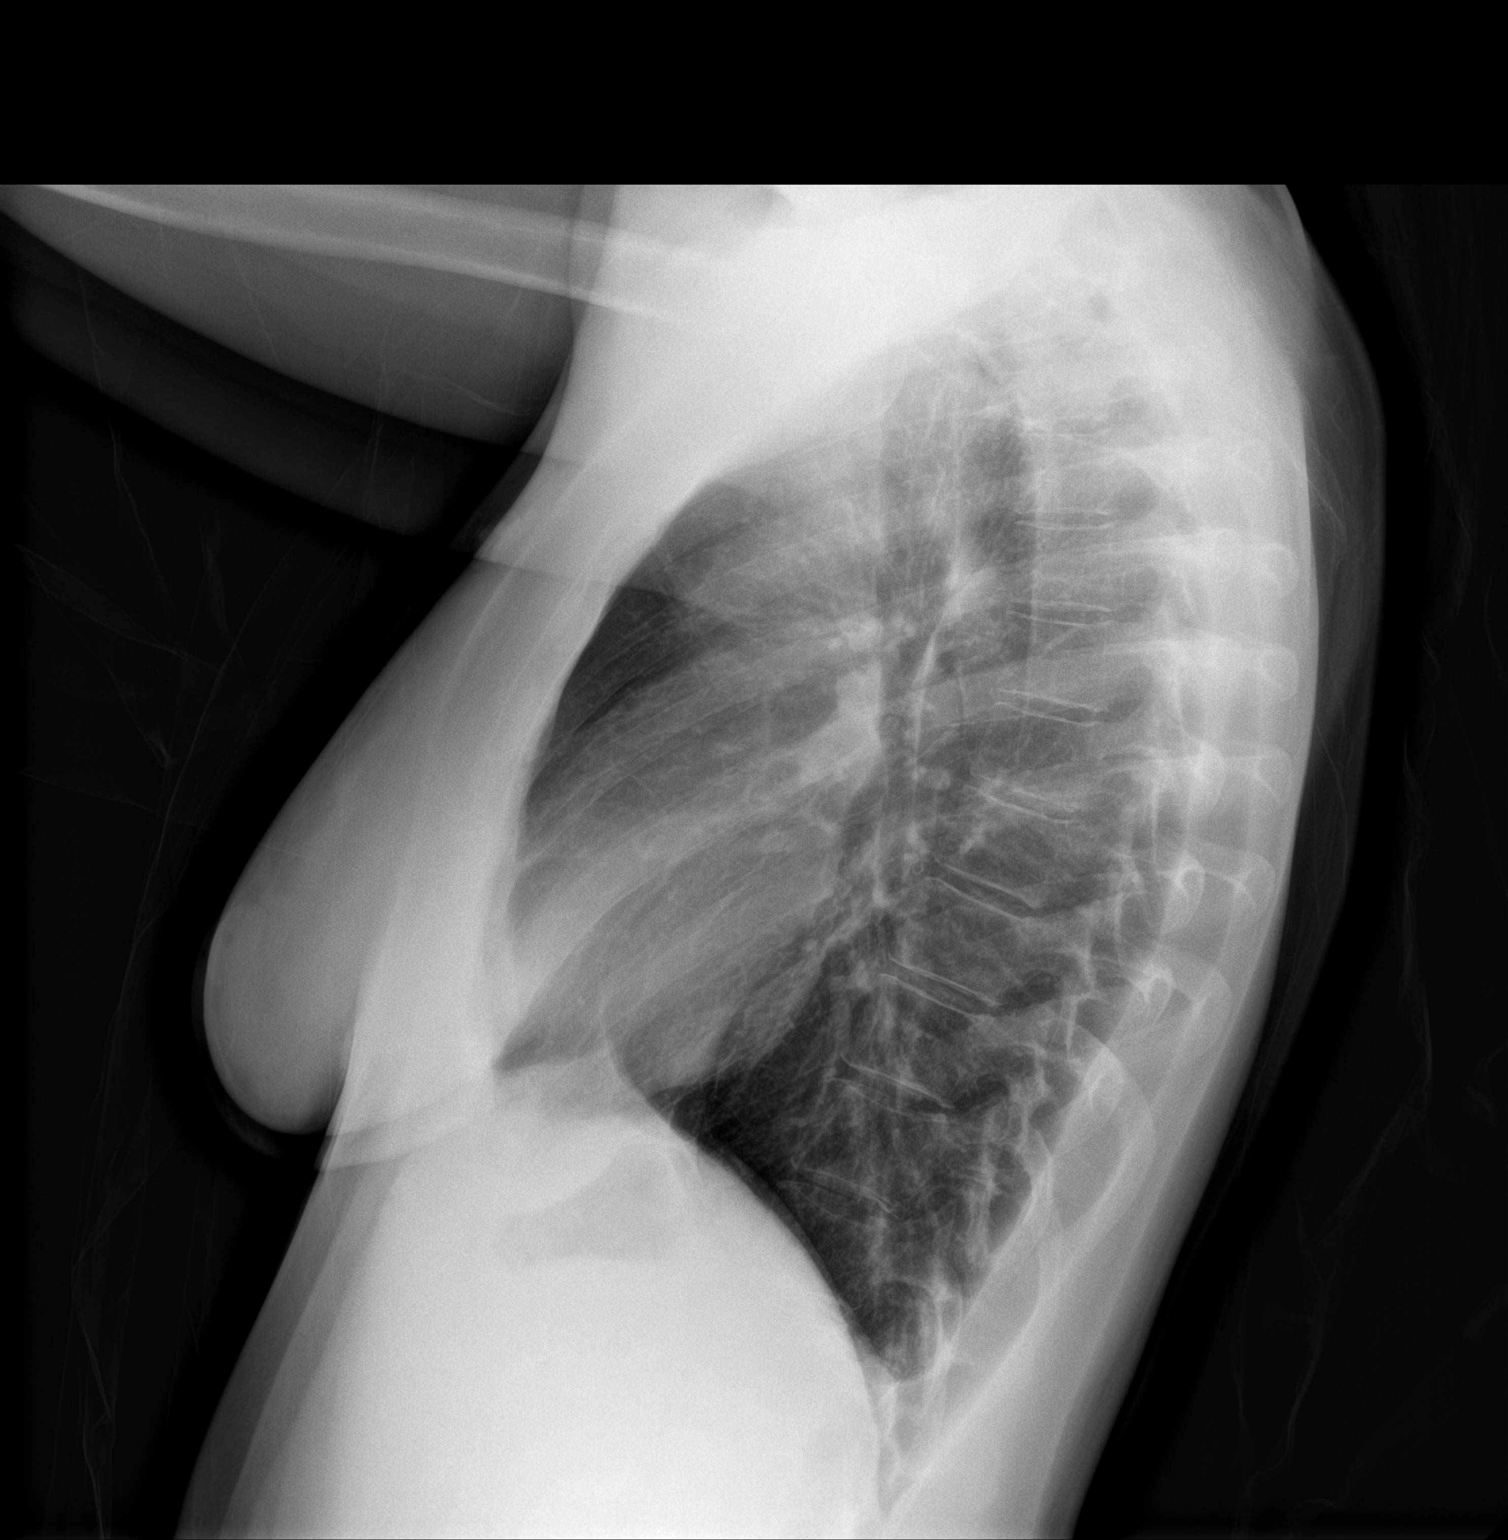

[2 of 2 positions shown; findings below may reference images not displayed]

FINDINGS: Normal heart size and mediastinal contours. Minor streaky basilar
atelectasis. No definite pneumonia, collapse or consolidation.
Negative for edema, effusion or pneumothorax. Trachea midline. No
acute osseous finding.
IMPRESSION: Minor basilar atelectasis. No other acute process by plain
radiography.
